# Patient Record
Sex: Female | Born: 1970 | Race: White | Hispanic: No | Marital: Married | Smoking: Never smoker
Health system: Southern US, Community
[De-identification: ages and names within clinical notes are randomized; demographics above are authoritative.]

## PROBLEM LIST (undated history)

## (undated) DIAGNOSIS — F419 Anxiety disorder, unspecified: Secondary | ICD-10-CM

---

## 2018-10-31 ENCOUNTER — Other Ambulatory Visit: Payer: Self-pay | Admitting: Obstetrics and Gynecology

## 2018-10-31 DIAGNOSIS — Z1231 Encounter for screening mammogram for malignant neoplasm of breast: Secondary | ICD-10-CM

## 2018-11-27 ENCOUNTER — Ambulatory Visit
Admission: RE | Admit: 2018-11-27 | Discharge: 2018-11-27 | Disposition: A | Discharge status: Home / Self Care | Payer: Federal, State, Local not specified - PPO | Source: Ambulatory Visit | Attending: Obstetrics and Gynecology | Admitting: Obstetrics and Gynecology

## 2018-11-27 ENCOUNTER — Encounter (INDEPENDENT_AMBULATORY_CARE_PROVIDER_SITE_OTHER): Payer: Self-pay

## 2018-11-27 ENCOUNTER — Other Ambulatory Visit: Payer: Self-pay

## 2018-11-27 DIAGNOSIS — Z1231 Encounter for screening mammogram for malignant neoplasm of breast: Secondary | ICD-10-CM | POA: Diagnosis not present

## 2020-01-10 ENCOUNTER — Other Ambulatory Visit: Payer: Self-pay | Admitting: Physician Assistant

## 2020-01-10 DIAGNOSIS — Z1231 Encounter for screening mammogram for malignant neoplasm of breast: Secondary | ICD-10-CM

## 2020-01-17 ENCOUNTER — Encounter (INDEPENDENT_AMBULATORY_CARE_PROVIDER_SITE_OTHER): Payer: Self-pay

## 2020-01-17 ENCOUNTER — Other Ambulatory Visit: Payer: Self-pay

## 2020-01-17 ENCOUNTER — Ambulatory Visit
Admission: RE | Admit: 2020-01-17 | Discharge: 2020-01-17 | Disposition: A | Discharge status: Home / Self Care | Payer: Federal, State, Local not specified - PPO | Source: Ambulatory Visit | Attending: Physician Assistant | Admitting: Physician Assistant

## 2020-01-17 DIAGNOSIS — Z1231 Encounter for screening mammogram for malignant neoplasm of breast: Secondary | ICD-10-CM | POA: Diagnosis present

## 2020-02-26 ENCOUNTER — Ambulatory Visit
Admission: EM | Admit: 2020-02-26 | Discharge: 2020-02-26 | Disposition: A | Discharge status: Home / Self Care | Payer: Federal, State, Local not specified - PPO | Attending: Emergency Medicine | Admitting: Emergency Medicine

## 2020-02-26 ENCOUNTER — Ambulatory Visit (INDEPENDENT_AMBULATORY_CARE_PROVIDER_SITE_OTHER): Payer: Federal, State, Local not specified - PPO

## 2020-02-26 ENCOUNTER — Other Ambulatory Visit: Payer: Self-pay

## 2020-02-26 DIAGNOSIS — R079 Chest pain, unspecified: Secondary | ICD-10-CM

## 2020-02-26 HISTORY — DX: Anxiety disorder, unspecified: F41.9

## 2020-02-26 LAB — TROPONIN I (HIGH SENSITIVITY): Troponin I (High Sensitivity): 4 ng/L (ref <18)

## 2020-02-26 MED ORDER — IBUPROFEN 600 MG PO TABS: 600.0000 mg | ORAL_TABLET | Freq: Four times a day (QID) | ORAL | 0 refills | Status: AC | PRN

## 2020-02-26 NOTE — ED Provider Notes (Signed)
HPI  SUBJECTIVE:  Courtney Knapp is a 49 y.o. female who presents with 2 days of intermittent dull, achy twinges of migratory lower chest pain that lasts a minute or 2.  States that it is underneath her left breast, between her shoulder blades, and right lower ribs and in the substernal region.  There is no exertional component.  She was able to do stairs without provoking the pain.  It happens at one place in a time.  No nausea, diaphoresis, palpitations, shortness of breath, syncope.  No change in her physical activity, recent change in her workout routine.  No trauma to the chest, no cough, wheeze, abdominal pain.  No recent viral illness.  No calf pain, swelling, hemoptysis, surgery in the past 4 weeks, exogenous estrogen.  She had symptoms like this once before,  and it resolved on its own.  She did not seek medical attention for this.  she also is reporting GERD symptoms.  She tried Tums which helped with her GERD symptoms.  Did not help with the chest pain.  Symptoms not associated with exertion, torso rotation, arm movement.  She has a past medical history of gastritis for which she occasionally takes omeprazole and GERD which she states is bothering her now.  No history of DVT, PE, smoking, cancer, hypercholesterolemia, diabetes, hypertension, coronary artery disease, stroke.  Family history significant for father with MI 23s.  PMD: NP Virgia Land    Past Medical History:  Diagnosis Date  . Anxiety     History reviewed. No pertinent surgical history.  Family History  Problem Relation Age of Onset  . Atrial fibrillation Mother   . Hypertension Mother   . Breast cancer Neg Hx     Social History   Tobacco Use  . Smoking status: Never Smoker  . Smokeless tobacco: Never Used  Vaping Use  . Vaping Use: Never used  Substance Use Topics  . Alcohol use: Never  . Drug use: Never    No current facility-administered medications for this encounter.  Current Outpatient  Medications:  .  cetirizine (ZYRTEC) 10 MG tablet, Take 10 mg by mouth daily., Disp: , Rfl:  .  sertraline (ZOLOFT) 50 MG tablet, Take 50 mg by mouth daily., Disp: , Rfl:  .  ibuprofen (ADVIL) 600 MG tablet, Take 1 tablet (600 mg total) by mouth every 6 (six) hours as needed., Disp: 30 tablet, Rfl: 0  No Known Allergies   ROS  As noted in HPI.   Physical Exam  BP 126/81 (BP Location: Left Arm)   Pulse 66   Temp 98.3 F (36.8 C) (Oral)   Resp 18   SpO2 100%   Constitutional: Well developed, well nourished, no acute distress Eyes:  EOMI, conjunctiva normal bilaterally HENT: Normocephalic, atraumatic,mucus membranes moist Respiratory: Normal inspiratory effort, lungs clear bilaterally.  No chest wall tenderness over entire chest. Cardiovascular: Normal rate, regular rhythm no murmurs rubs or gallops.  RP 2+ and equal bilaterally. GI: nondistended soft, nontender, no hepatomegaly, splenomegaly. skin: No rash, skin intact Musculoskeletal: Calves symmetric, nontender, no edema. Neurologic: Alert & oriented x 3, no focal neuro deficits Psychiatric: Speech and behavior appropriate   ED Course   Medications - No data to display  Orders Placed This Encounter  Procedures  . DG Chest 2 View    Standing Status:   Standing    Number of Occurrences:   1    Order Specific Question:   Reason for Exam (SYMPTOM  OR DIAGNOSIS REQUIRED)  Answer:   migratory CP  . ED EKG    Standing Status:   Standing    Number of Occurrences:   1    Order Specific Question:   Reason for Exam    Answer:   Chest Pain    Order Specific Question:   Release to patient    Answer:   Immediate  . EKG 12-Lead    Standing Status:   Standing    Number of Occurrences:   1    Results for orders placed or performed during the hospital encounter of 02/26/20 (from the past 24 hour(s))  Troponin I (High Sensitivity)     Status: None   Collection Time: 02/26/20  6:26 PM  Result Value Ref Range   Troponin I  (High Sensitivity) 4 <18 ng/L   DG Chest 2 View  Result Date: 02/26/2020 CLINICAL DATA:  Migratory chest pain EXAM: CHEST - 2 VIEW COMPARISON:  None. FINDINGS: No consolidation, features of edema, pneumothorax, or effusion. Pulmonary vascularity is normally distributed. The cardiomediastinal contours are unremarkable. No acute osseous or soft tissue abnormality. Slight levocurvature of the midthoracic spine. Mild multilevel degenerative changes are present in the imaged portions of the spine. IMPRESSION: No acute cardiopulmonary abnormality. Electronically Signed   By: Kreg Shropshire M.D.   On: 02/26/2020 18:44    ED Clinical Impression  1. Nonspecific chest pain      ED Assessment/Plan  Normal sinus rhythm, rate 60.  Normal axis, normal intervals.  No hypertrophy.  No ST-T wave changes.  No previous EKG for comparison.   will check chest x-ray.  Patient does not meet PERC criteria due to age.  However Wells criteria is 0.  Doubt PE in the absence of tachycardia, hypoxia, signs of DVT.  Patient's HEART score is 2.  Risk of major cardiac events is 0.9 to 1.7%.  Discussed this with patient.  will get a single troponin as it has been going on for 2 days. I would expect that if this was ACS it would be positive.  Reviewed imaging independently.  Normal chest x-ray.  See radiology report for full details.  Chest x-ray normal, troponin negative.  EKG is reassuring.  Unsure as to etiology of chest pain, but does not appear to be a life-threatening cause.  We can try Tylenol and ibuprofen as needed.  Follow-up with PMD in several days, to the ER if she gets worse.  Discussed labs, imaging, MDM, treatment plan, and plan for follow-up with patient. Discussed sn/sx that should prompt return to the ED. patient agrees with plan.   Meds ordered this encounter  Medications  . ibuprofen (ADVIL) 600 MG tablet    Sig: Take 1 tablet (600 mg total) by mouth every 6 (six) hours as needed.    Dispense:  30  tablet    Refill:  0    *This clinic note was created using Scientist, clinical (histocompatibility and immunogenetics). Therefore, there may be occasional mistakes despite careful proofreading.   ?    Domenick Gong, MD 02/27/20 [phone]

## 2020-02-26 NOTE — Discharge Instructions (Addendum)
Chest x-ray was normal and your troponin was negative.  Your risk for having him heart attack or other major cardiac event in the next 30 days is 0.9 to 1.7%, which is very low probability.    600 mg of ibuprofen combined with Tylenol 3-4 times a day as needed for pain.  I would start taking your GERD/gastritis medication.  Follow-up with your primary care provider in several days if you are not getting any better, go to the ER for worsening chest pain, shortness of breath, palpitations, if you pass out, or for other concerns.

## 2020-02-26 NOTE — ED Triage Notes (Signed)
Pt reports "twinges" of discomfort intermittent since Monday.  First time on L torso, second on R ribs, currently a tightness between shoulder blades. No associated s/s until some dizziness today.  No cardiac hx.

## 2021-01-21 ENCOUNTER — Other Ambulatory Visit: Payer: Self-pay | Admitting: Physician Assistant

## 2021-01-21 DIAGNOSIS — Z1231 Encounter for screening mammogram for malignant neoplasm of breast: Secondary | ICD-10-CM

## 2021-02-18 LAB — EXTERNAL GENERIC LAB PROCEDURE: COLOGUARD: NEGATIVE

## 2021-02-18 LAB — COLOGUARD: COLOGUARD: NEGATIVE

## 2021-04-01 ENCOUNTER — Ambulatory Visit: Payer: Federal, State, Local not specified - PPO

## 2021-04-15 ENCOUNTER — Other Ambulatory Visit: Payer: Self-pay

## 2021-04-15 ENCOUNTER — Ambulatory Visit
Admission: RE | Admit: 2021-04-15 | Discharge: 2021-04-15 | Disposition: A | Discharge status: Home / Self Care | Payer: Federal, State, Local not specified - PPO | Source: Ambulatory Visit | Attending: Physician Assistant | Admitting: Physician Assistant

## 2021-04-15 DIAGNOSIS — Z1231 Encounter for screening mammogram for malignant neoplasm of breast: Secondary | ICD-10-CM | POA: Insufficient documentation

## 2022-01-25 ENCOUNTER — Other Ambulatory Visit: Payer: Self-pay | Admitting: Physician Assistant

## 2022-01-25 DIAGNOSIS — Z1231 Encounter for screening mammogram for malignant neoplasm of breast: Secondary | ICD-10-CM

## 2022-06-06 ENCOUNTER — Ambulatory Visit
Admission: RE | Admit: 2022-06-06 | Discharge: 2022-06-06 | Disposition: A | Discharge status: Home / Self Care | Payer: Federal, State, Local not specified - PPO | Source: Ambulatory Visit | Attending: Physician Assistant | Admitting: Physician Assistant

## 2022-06-06 DIAGNOSIS — Z1231 Encounter for screening mammogram for malignant neoplasm of breast: Secondary | ICD-10-CM | POA: Diagnosis present

## 2023-02-07 ENCOUNTER — Inpatient Hospital Stay
Payer: Federal, State, Local not specified - PPO | Attending: Physician Assistant | Admitting: Licensed Clinical Social Worker

## 2023-02-07 ENCOUNTER — Inpatient Hospital Stay: Payer: Federal, State, Local not specified - PPO

## 2023-02-07 ENCOUNTER — Encounter: Payer: Self-pay | Admitting: Licensed Clinical Social Worker

## 2023-02-07 DIAGNOSIS — Z808 Family history of malignant neoplasm of other organs or systems: Secondary | ICD-10-CM | POA: Diagnosis not present

## 2023-02-07 DIAGNOSIS — Z800 Family history of malignant neoplasm of digestive organs: Secondary | ICD-10-CM | POA: Diagnosis not present

## 2023-02-07 DIAGNOSIS — Z8049 Family history of malignant neoplasm of other genital organs: Secondary | ICD-10-CM | POA: Diagnosis not present

## 2023-02-07 NOTE — Progress Notes (Signed)
REFERRING PROVIDER: Schermerhorn, Ihor Austin, MD [address] [facility] [place],  [place] [address]  PRIMARY PROVIDER:  Randa Ngo, CNM  PRIMARY REASON FOR VISIT:  1. Family history of uterine cancer   2. Family history of brain cancer   3. Family history of esophageal cancer      HISTORY OF PRESENT ILLNESS:   Courtney Knapp, a 52 y.o. female, was seen for a [facility] cancer genetics consultation at the request of Dr. Feliberto Gottron due to a family history of uterine cancer.  Courtney Knapp presents to clinic today to discuss the possibility of a hereditary predisposition to cancer, genetic testing, and to further clarify her future cancer risks, as well as potential cancer risks for family members.   CANCER HISTORY:   Courtney Knapp is a 52 y.o. female with no personal history of cancer.    RISK FACTORS:  Menarche was at age 83.  First live birth at age 39.  Ovaries intact: yes.  Hysterectomy: no.  Menopausal status: postmenopausal.  HRT use: just started Colonoscopy: no; not examined. - Cologuard normal Mammogram within the last year: yes. Number of breast biopsies: 0.   Past Medical History:  Diagnosis Date   Anxiety     FAMILY HISTORY:  We obtained a detailed, 4-generation family history.  Significant diagnoses are listed below: Family History  Problem Relation Age of Onset   Atrial fibrillation Mother    Hypertension Mother    Uterine cancer Mother    Esophageal cancer Father    Uterine cancer Sister        dx late 21s-early 64s   Uterine cancer Sister        dx 71   Cancer Maternal Grandmother        unk type   Brain cancer Maternal Grandfather    Breast cancer Neg Hx    Courtney Knapp has 2 daughters, 65 and 69. She has 3 sisters. One sister had uterine cancer in her late 40s-early 56s and another sister had uterine cancer at 64.   Courtney Knapp mother had uterine cancer and hysterectomy, but otherwise limited details about the  cancer. Maternal grandfather had brain cancer, maternal grandmother had cancer, unknown type.  Courtney Knapp father had esophageal cancer and passed at 90. A paternal aunt may have had stomach/small intestine cancer.  Courtney Knapp is unaware of previous family history of genetic testing for hereditary cancer risks. There is no reported Ashkenazi Jewish ancestry. There is no known consanguinity.    GENETIC COUNSELING ASSESSMENT: Courtney Knapp is a 52 y.o. female with a family history of uterine cancer which is somewhat suggestive of a hereditary cancer syndrome and predisposition to cancer. We, therefore, discussed and recommended the following at today's visit.   DISCUSSION: We discussed that approximately 10% of cancer is hereditary. Most cases of hereditary endometrial/uterine cancer are associated with Lynch syndrome genes, although there are other genes associated with hereditary cancer as well. Cancers and risks are gene specific. We discussed that testing is beneficial for several reasons including knowing about cancer risks, identifying potential screening and risk-reduction options that may be appropriate, and to understand if other family members could be at risk for cancer and allow them to undergo genetic testing.   We reviewed the characteristics, features and inheritance patterns of hereditary cancer syndromes. We also discussed genetic testing, including the appropriate family members to test, the process of testing, insurance coverage and turn-around-time for results. We discussed the implications of a negative,  positive and/or variant of uncertain significant result. We recommended Courtney Knapp pursue genetic testing for the [facility] CancerNext-Expanded+RNA gene panel.   Based on Courtney Knapp family history of cancer, she meets medical criteria for genetic testing. Despite that she meets criteria, she may still have an out of pocket cost.   PLAN: After considering the risks,  benefits, and limitations, Courtney Knapp provided informed consent to pursue genetic testing and the blood sample was sent to [facility] for analysis of the CancerNext-Expanded+RNA panel. Results should be available within approximately 2-3 weeks' time, at which point they will be disclosed by telephone to Courtney Knapp, as will any additional recommendations warranted by these results. Courtney Knapp will receive a summary of her genetic counseling visit and a copy of her results once available. This information will also be available in Epic.   Courtney Knapp questions were answered to her satisfaction today. Our contact information was provided should additional questions or concerns arise. Thank you for the referral and allowing [place] to share in the care of your patient.   Lacy Duverney, MS, [facility] Genetic Counselor [email or web address] Phone: [phone]  The patient was seen for a total of 25 minutes in face-to-face genetic counseling.  Dr. Blake Divine was available for discussion regarding this case.   _______________________________________________________________________ For Office Staff:  Number of people involved in session: 1 Was an Intern/ student involved with case: no

## 2023-02-22 ENCOUNTER — Encounter: Payer: Self-pay | Admitting: Licensed Clinical Social Worker

## 2023-02-22 ENCOUNTER — Ambulatory Visit: Payer: Self-pay | Admitting: Licensed Clinical Social Worker

## 2023-02-22 ENCOUNTER — Telehealth: Payer: Self-pay | Admitting: Licensed Clinical Social Worker

## 2023-02-22 DIAGNOSIS — Z1379 Encounter for other screening for genetic and chromosomal anomalies: Secondary | ICD-10-CM

## 2023-02-22 NOTE — Telephone Encounter (Signed)
I contacted Ms. Menger to discuss her genetic testing results. No pathogenic variants were identified in the 76 genes analyzed. Detailed clinic note to follow.   The test report has been scanned into EPIC and is located under the Molecular Pathology section of the Results Review tab.  A portion of the result report is included below for reference.      Lacy Duverney, MS, [facility] Genetic Counselor [email or web address] Phone: [phone]

## 2023-02-22 NOTE — Progress Notes (Signed)
HPI:   Courtney Knapp was previously seen in the [facility] due to a family history of cancer and concerns regarding a hereditary predisposition to cancer. Please refer to our prior cancer genetics clinic note for more information regarding our discussion, assessment and recommendations, at the time. Courtney Knapp recent genetic test results were disclosed to her, as were recommendations warranted by these results. These results and recommendations are discussed in more detail below.  CANCER HISTORY:  Oncology History   No history exists.    FAMILY HISTORY:  We obtained a detailed, 4-generation family history.  Significant diagnoses are listed below: Family History  Problem Relation Age of Onset   Atrial fibrillation Mother    Hypertension Mother    Uterine cancer Mother    Esophageal cancer Father    Uterine cancer Sister        dx late 3s-early 54s   Uterine cancer Sister        dx 80   Cancer Maternal Grandmother        unk type   Brain cancer Maternal Grandfather    Breast cancer Neg Hx    Ms. Kapsner has 2 daughters, 78 and 81. She has 3 sisters. One sister had uterine cancer in her late 40s-early 67s and another sister had uterine cancer at 14.    Courtney Knapp mother had uterine cancer and hysterectomy, but otherwise limited details about the cancer. Maternal grandfather had brain cancer, maternal grandmother had cancer, unknown type.   Courtney Knapp's father had esophageal cancer and passed at 37. A paternal aunt may have had stomach/small intestine cancer.   Courtney Knapp is unaware of previous family history of genetic testing for hereditary cancer risks. There is no reported Ashkenazi Jewish ancestry. There is no known consanguinity.    GENETIC TEST RESULTS:  The [facility] CancerNext-Expanded+RNA Panel found no pathogenic mutations.   The CancerNext-Expanded gene panel offered by [facility] and includes sequencing, rearrangement, and RNA  analysis for the following 76 genes: AIP, ALK, APC, ATM, AXIN2, BAP1, BARD1, BMPR1A, BRCA1, BRCA2, BRIP1, CDC73, CDH1, CDK4, CDKN1B, CDKN2A, CEBPA, CHEK2, CTNNA1, DDX41, DICER1, ETV6, FH, FLCN, GATA2, LZTR1, MAX, MBD4, MEN1, MET, MLH1, MSH2, MSH3, MSH6, MUTYH, NF1, NF2, NTHL1, PALB2, PHOX2B, PMS2, POT1, PRKAR1A, PTCH1, PTEN, RAD51C, RAD51D, RB1, RET, RUNX1, SDHA, SDHAF2, SDHB, SDHC, SDHD, SMAD4, SMARCA4, SMARCB1, SMARCE1, STK11, SUFU, TMEM127, TP53, TSC1, TSC2, VHL, and WT1 (sequencing and deletion/duplication); EGFR, HOXB13, KIT, MITF, PDGFRA, POLD1, and POLE (sequencing only); EPCAM and GREM1 (deletion/duplication only).   The test report has been scanned into EPIC and is located under the Molecular Pathology section of the Results Review tab.  A portion of the result report is included below for reference. Genetic testing reported out on 02/21/2023.     Even though a pathogenic variant was not identified, possible explanations for the cancer in the family may include: There may be no hereditary risk for cancer in the family. The cancers in Courtney Knapp and/or her family may be sporadic/familial or due to other genetic and environmental factors. There may be a gene mutation in one of these genes that current testing methods cannot detect but that chance is small. There could be another gene that has not yet been discovered, or that we have not yet tested, that is responsible for the cancer diagnoses in the family.  It is also possible there is a hereditary cause for the cancer in the family that [name] did not inherit Therefore, it is  important to remain in touch with cancer genetics in the future so that we can continue to offer Courtney Knapp the most up to date genetic testing.   ADDITIONAL GENETIC TESTING:  Ms. Larch's genetic testing was fairly extensive.  If there are additional relevant genes identified to increase cancer risk that can be analyzed in the future, we would be happy to  discuss and coordinate this testing at that time.    CANCER SCREENING RECOMMENDATIONS:  Courtney Knapp test result is considered negative (normal).  This means that we have not identified a hereditary cause for her family history of cancer at this time.   An individual's cancer risk and medical management are not determined by genetic test results alone. Overall cancer risk assessment incorporates additional factors, including personal medical history, family history, and any available genetic information that may result in a personalized plan for cancer prevention and surveillance. Therefore, it is recommended she continue to follow the cancer management and screening guidelines provided by her  primary healthcare provider.  RECOMMENDATIONS FOR FAMILY MEMBERS:   Since she did not inherit a identifiable mutation in a cancer predisposition gene included on this panel, her children could not have inherited a known mutation from her in one of these genes. Individuals in this family might be at some increased risk of developing cancer, over the general population risk, due to the family history of cancer.  Individuals in the family should notify their providers of the family history of cancer. We recommend women in this family have a yearly mammogram beginning at age 83, or 62 years younger than the earliest onset of cancer, an annual clinical breast exam, and perform monthly breast self-exams.  Family members should have colonoscopies by at age 33, or earlier, as recommended by their providers. Other members of the family may still carry a pathogenic variant in one of these genes that Courtney Knapp did not inherit. Based on the family history, we recommend her sisters who had uterine cancer have genetic counseling and testing. Courtney Knapp will let Courtney Knapp know if we can be of any assistance in coordinating genetic counseling and/or testing for these family members.    FOLLOW-UP:  Lastly, we discussed with  Courtney Knapp that cancer genetics is a rapidly advancing field and it is possible that new genetic tests will be appropriate for her and/or her family members in the future. We encouraged her to remain in contact with cancer genetics on an annual basis so we can update her personal and family histories and let her know of advances in cancer genetics that may benefit this family.   Our contact number was provided. Ms. Tack questions were answered to her satisfaction, and she knows she is welcome to call Courtney Knapp at anytime with additional questions or concerns.    Lacy Duverney, MS, [facility] Genetic Counselor [email or web address] Phone: [phone]

## 2023-08-06 IMAGING — MG MM DIGITAL SCREENING BILAT W/ TOMO AND CAD
8 series · 8 of 24 positions shown · non-contrast
Comparison: Previous exam(s).

CLINICAL DATA: Screening.

EXAM:
DIGITAL SCREENING BILATERAL MAMMOGRAM WITH TOMOSYNTHESIS AND CAD
TECHNIQUE: Bilateral screening digital craniocaudal and mediolateral oblique
mammograms were obtained. Bilateral screening digital breast
tomosynthesis was performed. The images were evaluated with
computer-aided detection.

[R CC synth-2D]
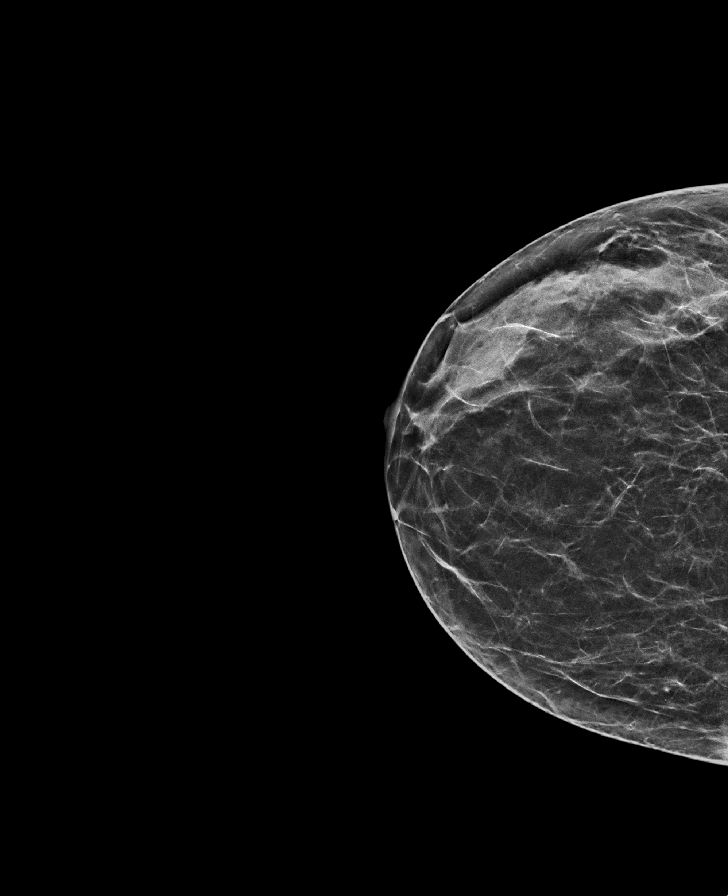

[L CC synth-2D]
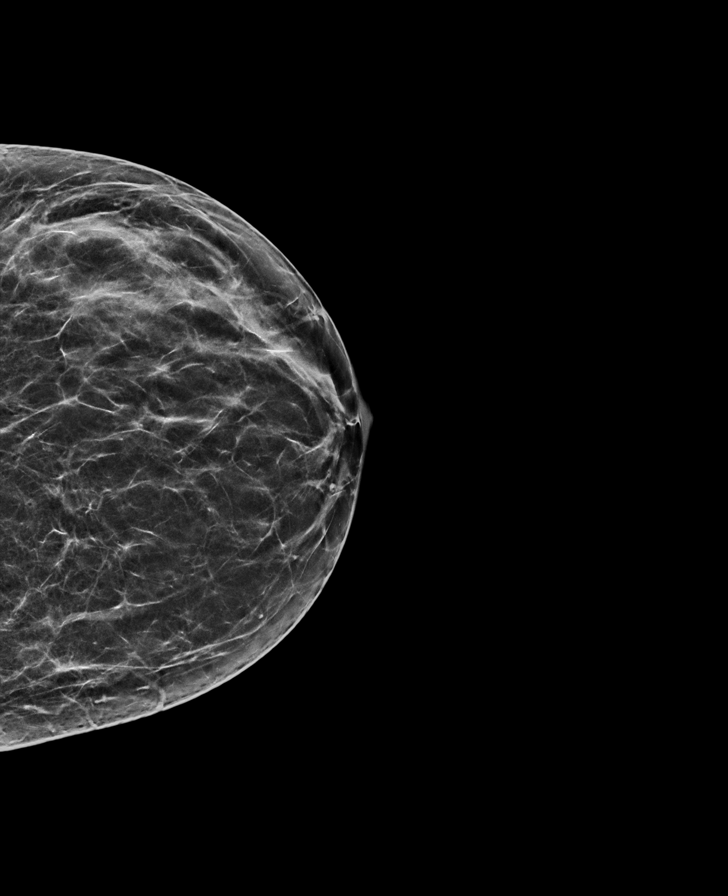

[R MLO synth-2D]
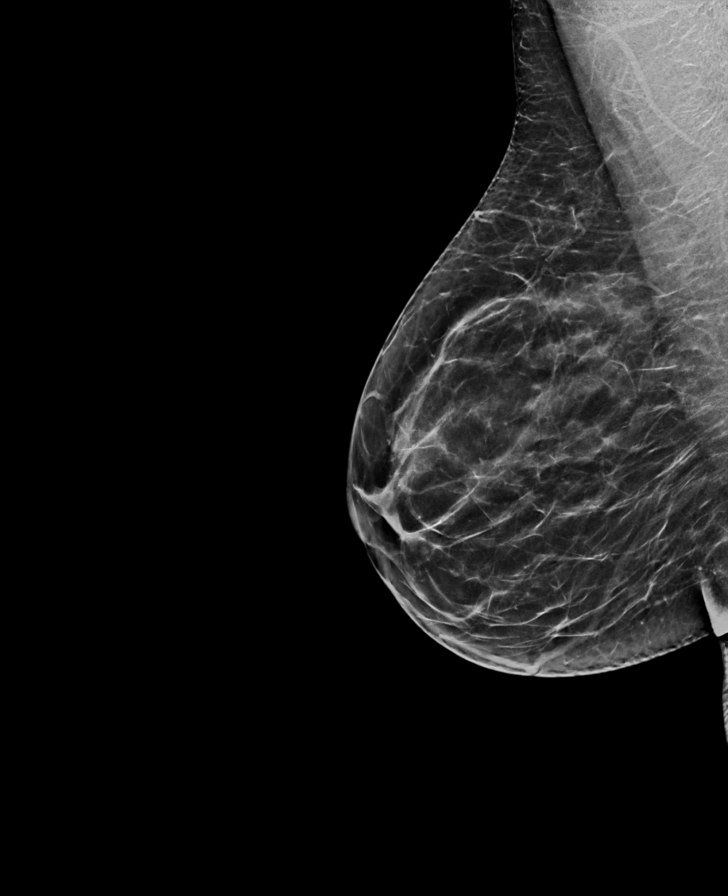

[L MLO synth-2D]
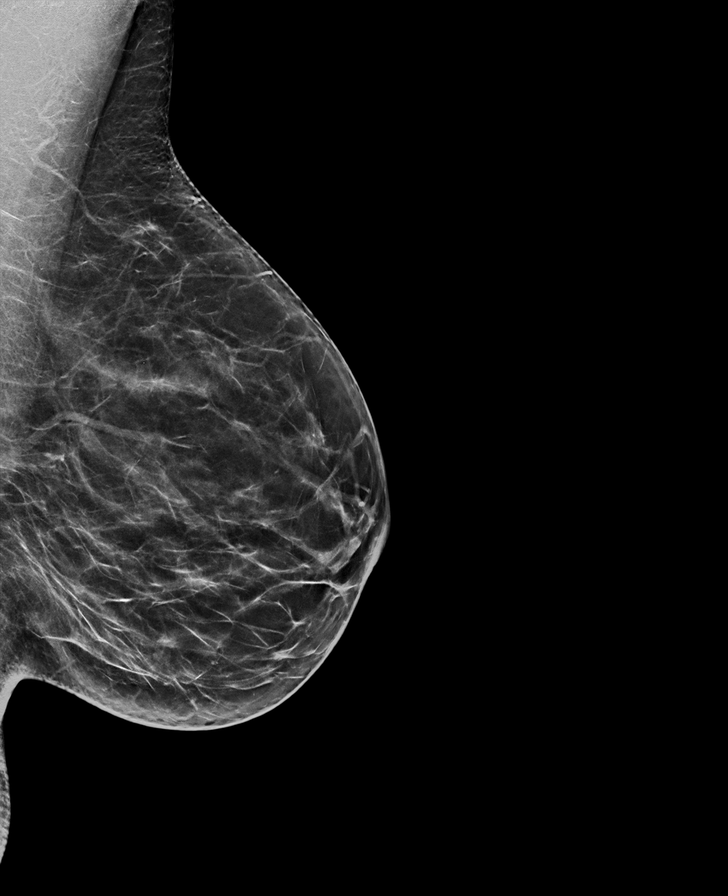

[R MLO tomo · tomo slice 33/66.0]
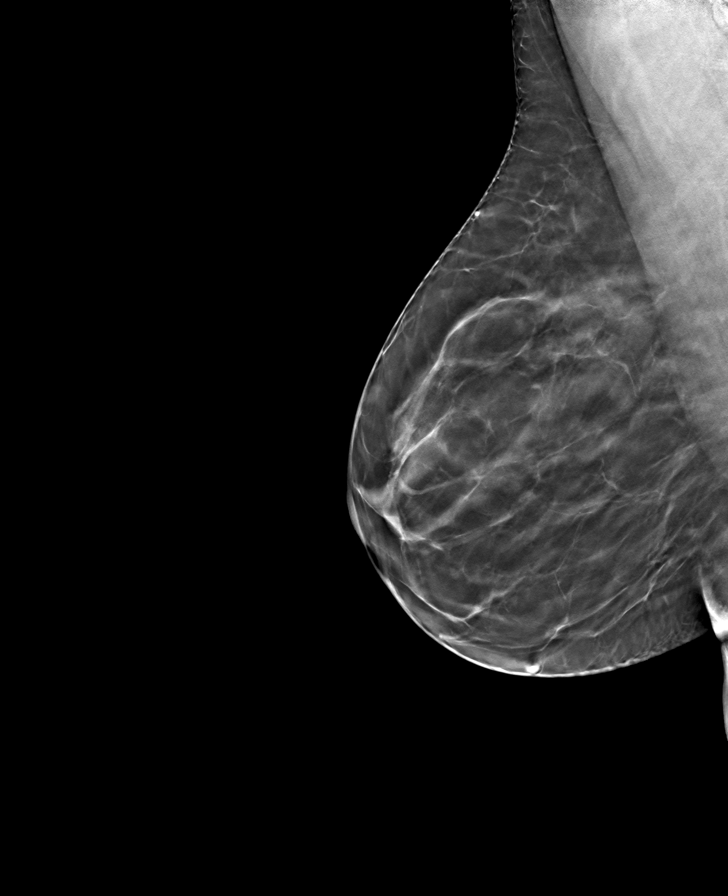

[R CC tomo · tomo slice 29/58.0]
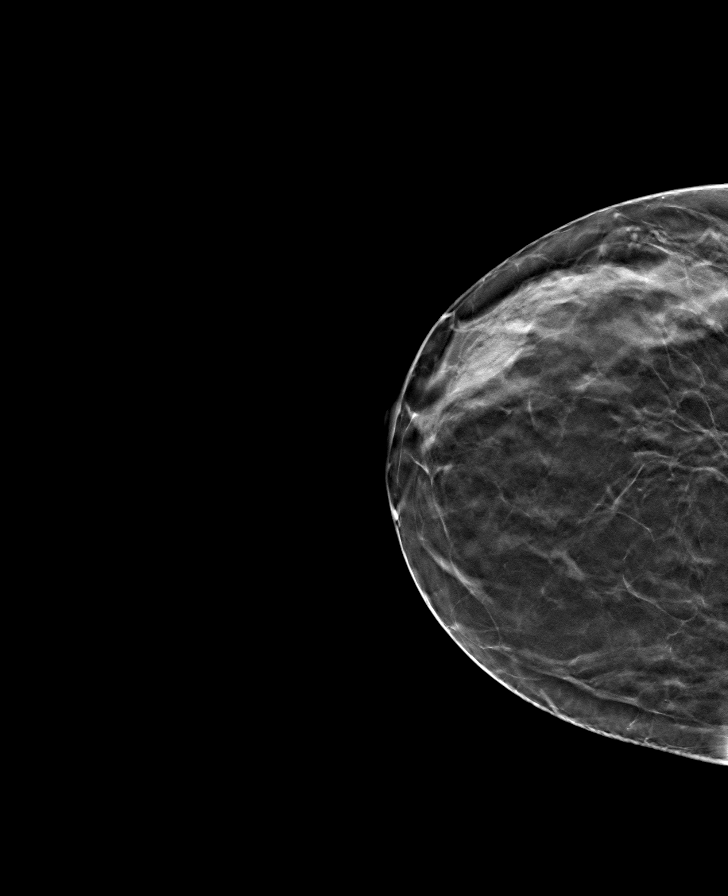

[L MLO tomo · tomo slice 33/66.0]
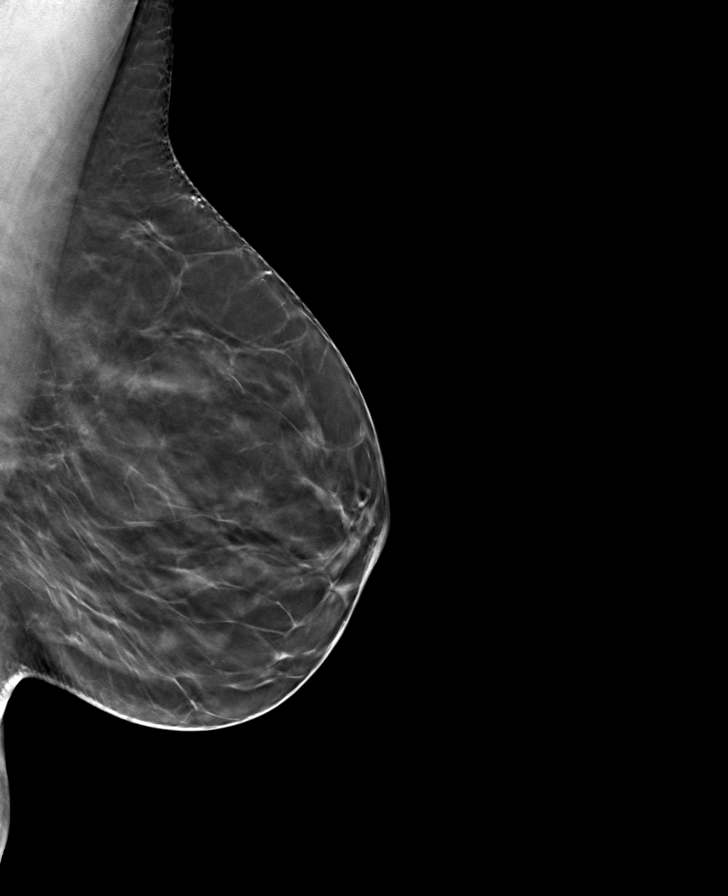

[L CC tomo · tomo slice 29/58.0]
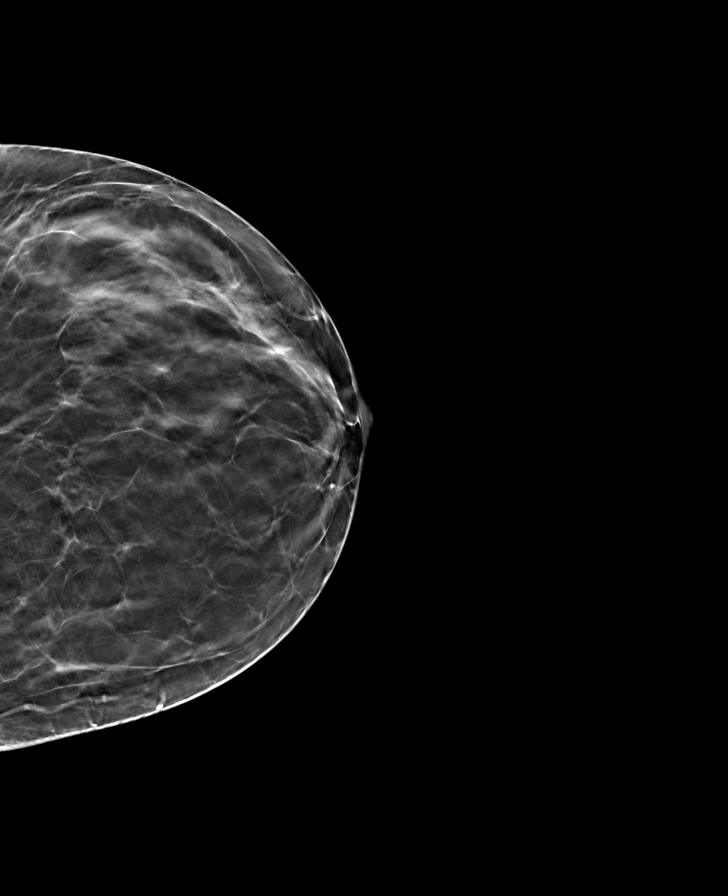

[8 of 24 positions shown; findings below may reference images not displayed]

ACR Breast Density Category b: There are scattered areas of
fibroglandular density.
FINDINGS: There are no findings suspicious for malignancy.
IMPRESSION: No mammographic evidence of malignancy. A result letter of this
screening mammogram will be mailed directly to the patient.

RECOMMENDATION:
Screening mammogram in one year. (Code:51-O-LD2)

BI-RADS CATEGORY  1: Negative.

## 2023-09-22 ENCOUNTER — Other Ambulatory Visit: Payer: Self-pay | Admitting: Physician Assistant

## 2023-09-22 DIAGNOSIS — Z1231 Encounter for screening mammogram for malignant neoplasm of breast: Secondary | ICD-10-CM

## 2023-11-09 ENCOUNTER — Ambulatory Visit
Admission: RE | Admit: 2023-11-09 | Discharge: 2023-11-09 | Disposition: A | Discharge status: Home / Self Care | Source: Ambulatory Visit | Attending: Physician Assistant | Admitting: Physician Assistant

## 2023-11-09 DIAGNOSIS — Z1231 Encounter for screening mammogram for malignant neoplasm of breast: Secondary | ICD-10-CM | POA: Insufficient documentation

## 2023-11-10 ENCOUNTER — Encounter: Payer: Self-pay | Admitting: Physician Assistant

## 2023-11-14 ENCOUNTER — Other Ambulatory Visit: Payer: Self-pay | Admitting: Physician Assistant

## 2023-11-14 DIAGNOSIS — R928 Other abnormal and inconclusive findings on diagnostic imaging of breast: Secondary | ICD-10-CM

## 2023-11-15 ENCOUNTER — Ambulatory Visit
Admission: RE | Admit: 2023-11-15 | Discharge: 2023-11-15 | Disposition: A | Discharge status: Home / Self Care | Source: Ambulatory Visit | Attending: Physician Assistant | Admitting: Physician Assistant

## 2023-11-15 DIAGNOSIS — R928 Other abnormal and inconclusive findings on diagnostic imaging of breast: Secondary | ICD-10-CM
